# Patient Record
Sex: Male | Born: 1980 | Race: White | Hispanic: No | Marital: Married
Health system: Southern US, Community
[De-identification: ages and names within clinical notes are randomized; demographics above are authoritative.]

---

## 2018-12-09 DIAGNOSIS — R197 Diarrhea, unspecified: Secondary | ICD-10-CM | POA: Diagnosis not present

## 2018-12-09 DIAGNOSIS — R52 Pain, unspecified: Secondary | ICD-10-CM | POA: Diagnosis not present

## 2019-07-04 DIAGNOSIS — Z23 Encounter for immunization: Secondary | ICD-10-CM | POA: Diagnosis not present

## 2019-07-04 DIAGNOSIS — Z0000 Encounter for general adult medical examination without abnormal findings: Secondary | ICD-10-CM | POA: Diagnosis not present

## 2020-03-05 DIAGNOSIS — H029 Unspecified disorder of eyelid: Secondary | ICD-10-CM | POA: Diagnosis not present

## 2020-03-05 DIAGNOSIS — R030 Elevated blood-pressure reading, without diagnosis of hypertension: Secondary | ICD-10-CM | POA: Diagnosis not present

## 2020-03-14 DIAGNOSIS — H0011 Chalazion right upper eyelid: Secondary | ICD-10-CM | POA: Diagnosis not present

## 2020-04-18 DIAGNOSIS — F341 Dysthymic disorder: Secondary | ICD-10-CM | POA: Diagnosis not present

## 2020-05-16 DIAGNOSIS — F341 Dysthymic disorder: Secondary | ICD-10-CM | POA: Diagnosis not present

## 2020-06-20 DIAGNOSIS — F341 Dysthymic disorder: Secondary | ICD-10-CM | POA: Diagnosis not present

## 2020-07-10 DIAGNOSIS — Z0000 Encounter for general adult medical examination without abnormal findings: Secondary | ICD-10-CM | POA: Diagnosis not present

## 2020-07-10 DIAGNOSIS — E669 Obesity, unspecified: Secondary | ICD-10-CM | POA: Diagnosis not present

## 2020-07-10 DIAGNOSIS — Z713 Dietary counseling and surveillance: Secondary | ICD-10-CM | POA: Diagnosis not present

## 2020-07-10 DIAGNOSIS — Z13220 Encounter for screening for lipoid disorders: Secondary | ICD-10-CM | POA: Diagnosis not present

## 2020-08-05 ENCOUNTER — Emergency Department (HOSPITAL_BASED_OUTPATIENT_CLINIC_OR_DEPARTMENT_OTHER)
Admission: EM | Admit: 2020-08-05 | Discharge: 2020-08-05 | Disposition: A | Discharge status: Home / Self Care | Payer: BC Managed Care – PPO | Attending: Emergency Medicine | Admitting: Emergency Medicine

## 2020-08-05 ENCOUNTER — Encounter (HOSPITAL_BASED_OUTPATIENT_CLINIC_OR_DEPARTMENT_OTHER): Payer: Self-pay | Admitting: Emergency Medicine

## 2020-08-05 ENCOUNTER — Other Ambulatory Visit: Payer: Self-pay

## 2020-08-05 ENCOUNTER — Emergency Department (HOSPITAL_BASED_OUTPATIENT_CLINIC_OR_DEPARTMENT_OTHER): Payer: BC Managed Care – PPO

## 2020-08-05 ENCOUNTER — Emergency Department (HOSPITAL_COMMUNITY): Payer: BC Managed Care – PPO

## 2020-08-05 DIAGNOSIS — R1011 Right upper quadrant pain: Secondary | ICD-10-CM | POA: Diagnosis not present

## 2020-08-05 DIAGNOSIS — R1010 Upper abdominal pain, unspecified: Secondary | ICD-10-CM

## 2020-08-05 DIAGNOSIS — K7689 Other specified diseases of liver: Secondary | ICD-10-CM | POA: Diagnosis not present

## 2020-08-05 DIAGNOSIS — R109 Unspecified abdominal pain: Secondary | ICD-10-CM | POA: Diagnosis not present

## 2020-08-05 DIAGNOSIS — K760 Fatty (change of) liver, not elsewhere classified: Secondary | ICD-10-CM | POA: Diagnosis not present

## 2020-08-05 DIAGNOSIS — K828 Other specified diseases of gallbladder: Secondary | ICD-10-CM | POA: Diagnosis not present

## 2020-08-05 DIAGNOSIS — R110 Nausea: Secondary | ICD-10-CM | POA: Insufficient documentation

## 2020-08-05 DIAGNOSIS — K8020 Calculus of gallbladder without cholecystitis without obstruction: Secondary | ICD-10-CM | POA: Diagnosis not present

## 2020-08-05 LAB — CBC WITH DIFFERENTIAL/PLATELET
Abs Immature Granulocytes: 0.02 10*3/uL (ref 0.00–0.07)
Basophils Absolute: 0.1 10*3/uL (ref 0.0–0.1)
Basophils Relative: 1 %
Eosinophils Absolute: 0.2 10*3/uL (ref 0.0–0.5)
Eosinophils Relative: 2 %
HCT: 44.6 % (ref 39.0–52.0)
Hemoglobin: 15 g/dL (ref 13.0–17.0)
Immature Granulocytes: 0 %
Lymphocytes Relative: 32 %
Lymphs Abs: 2.5 10*3/uL (ref 0.7–4.0)
MCH: 28.8 pg (ref 26.0–34.0)
MCHC: 33.6 g/dL (ref 30.0–36.0)
MCV: 85.8 fL (ref 80.0–100.0)
Monocytes Absolute: 0.7 10*3/uL (ref 0.1–1.0)
Monocytes Relative: 9 %
Neutro Abs: 4.3 10*3/uL (ref 1.7–7.7)
Neutrophils Relative %: 56 %
Platelets: 313 10*3/uL (ref 150–400)
RBC: 5.2 MIL/uL (ref 4.22–5.81)
RDW: 12.9 % (ref 11.5–15.5)
WBC: 7.9 10*3/uL (ref 4.0–10.5)
nRBC: 0 % (ref 0.0–0.2)

## 2020-08-05 LAB — COMPREHENSIVE METABOLIC PANEL
ALT: 89 U/L — ABNORMAL HIGH (ref 0–44)
AST: 35 U/L (ref 15–41)
Albumin: 4.4 g/dL (ref 3.5–5.0)
Alkaline Phosphatase: 50 U/L (ref 38–126)
Anion gap: 11 (ref 5–15)
BUN: 28 mg/dL — ABNORMAL HIGH (ref 6–20)
CO2: 23 mmol/L (ref 22–32)
Calcium: 9.4 mg/dL (ref 8.9–10.3)
Chloride: 105 mmol/L (ref 98–111)
Creatinine, Ser: 0.97 mg/dL (ref 0.61–1.24)
GFR, Estimated: >60 mL/min (ref >60)
Glucose, Bld: 111 mg/dL — ABNORMAL HIGH (ref 70–99)
Potassium: 4 mmol/L (ref 3.5–5.1)
Sodium: 139 mmol/L (ref 135–145)
Total Bilirubin: 0.5 mg/dL (ref 0.3–1.2)
Total Protein: 7.3 g/dL (ref 6.5–8.1)

## 2020-08-05 LAB — LIPASE, BLOOD: Lipase: 26 U/L (ref 11–51)

## 2020-08-05 MED ORDER — ONDANSETRON HCL 4 MG/2ML IJ SOLN
4.0000 mg | Freq: Once | INTRAMUSCULAR | Status: AC
  Administered 2020-08-05: 4 mg via INTRAVENOUS
  Filled 2020-08-05: qty 2

## 2020-08-05 MED ORDER — DICYCLOMINE HCL 10 MG/ML IM SOLN
20.0000 mg | Freq: Once | INTRAMUSCULAR | Status: AC
  Administered 2020-08-05: 20 mg via INTRAMUSCULAR
  Filled 2020-08-05: qty 2

## 2020-08-05 MED ORDER — ALUM & MAG HYDROXIDE-SIMETH 200-200-20 MG/5ML PO SUSP
30.0000 mL | Freq: Once | ORAL | Status: AC
  Administered 2020-08-05: 30 mL via ORAL
  Filled 2020-08-05: qty 30

## 2020-08-05 MED ORDER — LACTATED RINGERS IV BOLUS
1000.0000 mL | Freq: Once | INTRAVENOUS | Status: AC
  Administered 2020-08-05: 1000 mL via INTRAVENOUS

## 2020-08-05 MED ORDER — FENTANYL CITRATE (PF) 100 MCG/2ML IJ SOLN
50.0000 ug | Freq: Once | INTRAMUSCULAR | Status: AC
  Administered 2020-08-05: 50 ug via INTRAVENOUS
  Filled 2020-08-05: qty 2

## 2020-08-05 MED ORDER — DICYCLOMINE HCL 20 MG PO TABS: 20.0000 mg | ORAL_TABLET | Freq: Two times a day (BID) | ORAL | 0 refills | Status: AC

## 2020-08-05 MED ORDER — MORPHINE SULFATE (PF) 4 MG/ML IV SOLN
4.0000 mg | Freq: Once | INTRAVENOUS | Status: AC
  Administered 2020-08-05: 4 mg via INTRAVENOUS
  Filled 2020-08-05: qty 1

## 2020-08-05 NOTE — ED Provider Notes (Signed)
[facility] DEPT Provider Note   CSN: [id number] Arrival date & time: 08/05/20  0446     History Chief Complaint  Patient presents with  . Abdominal Pain    Eduardo Garcia is a 40 y.o. male.  HPI      Eduardo Garcia is a 40 y.o. male, patient with no diagnosed past medical history, presenting to the ED with upper abdominal pain beginning around 3 AM this morning. Pain woke the patient from sleep.  Last food was around 6 PM yesterday evening.  He describes it as a soreness and aching in the epigastric region, radiating toward the right upper quadrant, initially 9/10, constant. Denies routine alcohol use, illicit drug use, routine NSAID use. Patient was transferred from [facility] for ultrasound to evaluate gallbladder.  Denies fever/chills, N/V/D, chest pain, shortness of breath, urinary symptoms, hematochezia/melena, or any other complaints.   History reviewed. No pertinent past medical history.  There are no problems to display for this patient.   History reviewed. No pertinent surgical history.     History reviewed. No pertinent family history.     Home Medications Prior to Admission medications   Medication Sig Start Date End Date Taking? Authorizing Provider  dicyclomine (BENTYL) 20 MG tablet Take 1 tablet (20 mg total) by mouth 2 (two) times daily. 08/05/20  Yes Naaman Curro C, PA-C    Allergies    Patient has no known allergies.  Review of Systems   Review of Systems  Constitutional: Negative for chills, diaphoresis and fever.  Respiratory: Negative for shortness of breath.   Cardiovascular: Negative for chest pain.  Gastrointestinal: Positive for abdominal pain. Negative for blood in stool, diarrhea, nausea and vomiting.  Genitourinary: Negative for difficulty urinating, dysuria, flank pain and hematuria.  Musculoskeletal: Negative for back pain.  Neurological: Negative for syncope and weakness.  All other  systems reviewed and are negative.   Physical Exam Updated Vital Signs BP (!) 155/90 (BP Location: Right Arm)   Pulse 80   Temp 98 F (36.7 C)   Resp 20   Ht '6\' 1"'  (1.854 m)   Wt 113.4 kg   SpO2 100%   BMI 32.98 kg/m   Physical Exam Vitals and nursing note reviewed.  Constitutional:      General: He is not in acute distress.    Appearance: He is well-developed. He is not diaphoretic.  HENT:     Head: Normocephalic and atraumatic.     Mouth/Throat:     Mouth: Mucous membranes are moist.     Pharynx: Oropharynx is clear.  Eyes:     Conjunctiva/sclera: Conjunctivae normal.  Cardiovascular:     Rate and Rhythm: Normal rate and regular rhythm.     Pulses: Normal pulses.     Heart sounds: Normal heart sounds.  Pulmonary:     Effort: Pulmonary effort is normal. No respiratory distress.     Breath sounds: Normal breath sounds.  Abdominal:     General: Bowel sounds are normal.     Palpations: Abdomen is soft.     Tenderness: There is abdominal tenderness. There is no guarding.    Musculoskeletal:     Cervical back: Neck supple.  Skin:    General: Skin is warm and dry.  Neurological:     Mental Status: He is alert.  Psychiatric:        Mood and Affect: Mood and affect normal.        Speech: Speech normal.  Behavior: Behavior normal.     ED Results / Procedures / Treatments   Labs (all labs ordered are listed, but only abnormal results are displayed) Labs Reviewed  COMPREHENSIVE METABOLIC PANEL - Abnormal; Notable for the following components:      Result Value   Glucose, Bld 111 (*)    BUN 28 (*)    ALT 89 (*)    All other components within normal limits  CBC WITH DIFFERENTIAL/PLATELET  LIPASE, BLOOD    EKG None  Radiology US Abdomen Limited  Result Date: 08/05/2020 CLINICAL DATA:  40 year old male with right upper quadrant abdominal pain. Cholelithiasis on noncontrast CT Abdomen and Pelvis earlier today. EXAM: ULTRASOUND ABDOMEN LIMITED RIGHT  UPPER QUADRANT COMPARISON:  CT Abdomen and Pelvis 08/05/2020. FINDINGS: Gallbladder: Shadowing echogenic gallstones, individually estimated up to 7 mm (images 2, 22). Gallbladder wall thickness remains normal. No pericholecystic fluid. No sonographic Murphy sign elicited. Common bile duct: Diameter: 4 mm, normal. Liver: Echogenic background liver parenchyma (image 30) with around 2.7 cm hypoechoic area in the right hepatic lobe with some increased through transmission. No hypervascularity (image 21). Second similar small hypoechoic lesion in the right lobe measuring 15 mm on image 39. portal vein is patent on color Doppler imaging with normal direction of blood flow towards the liver. Other: Negative visible right kidney. IMPRESSION: 1. Cholelithiasis without evidence of acute cholecystitis or bile duct obstruction. 2. Fatty liver disease with two nonspecific hypoechoic liver lesions in the right lobe, the larger is 2.7 cm. Recommend routine outpatient follow-up abdomen MRI (liver protocol without and with contrast) to further characterize. This recommendation adapted from ACR consensus guidelines: Management of Incidental Liver Lesions on CT: A White Paper of the ACR Incidental Findings Committee. J Am Coll Radiol 2017; 95:0932-6712. Electronically Signed   By: Genevie Ann M.D.   On: 08/05/2020 08:11   CT Renal Stone Study  Result Date: 08/05/2020 CLINICAL DATA:  Upper abdominal pain which began at 0330 hours EXAM: CT ABDOMEN AND PELVIS WITHOUT CONTRAST TECHNIQUE: Multidetector CT imaging of the abdomen and pelvis was performed following the standard protocol without IV contrast. COMPARISON:  None. FINDINGS: Lower chest: Lung bases are clear. Normal heart size. No pericardial effusion. Hepatobiliary: Diffuse hepatic hypoattenuation compatible with hepatic steatosis. Regions of geographic focal fatty sparing are seen along the gallbladder fossa. An additional rounded subcapsular focus of intermediate attenuation  measuring up to 2.1 cm in size (2/30) in the periphery of the right lobe liver, could reflect additional fatty sparing or small lesion incompletely characterized on this exam. Smooth liver surface contour. Physiologic gallbladder distention. Few scattered calcified and partially pneumatized gallstones are seen within the gallbladder lumen towards the neck. No pericholecystic fluid or inflammation. No significant biliary ductal dilatation or intraductal gallstones are evident. Pancreas: No pancreatic ductal dilatation or surrounding inflammatory changes. Spleen: Normal in size. No concerning splenic lesions. Adrenals/Urinary Tract: Normal adrenals. Kidneys are symmetric in size and normally located. Some cortical lobulations may reflect regions of scarring or persistent fetal lobulation. No visible or contour deforming renal lesion. No urolithiasis or hydronephrosis. Urinary bladder is unremarkable. Stomach/Bowel: Distal esophagus, stomach and duodenum are unremarkable and free of acute abnormality. No small bowel thickening or dilatation. Normal air-filled appendix in the right lower quadrant without periappendiceal inflammation. No colonic dilatation or wall thickening. No evidence of bowel obstruction. Vascular/Lymphatic: No significant vascular findings are present. No enlarged abdominal or pelvic lymph nodes. Reproductive: The prostate and seminal vesicles are unremarkable. Other: No abdominopelvic free fluid or  free gas. No bowel containing hernias. Musculoskeletal: No acute osseous abnormality or suspicious osseous lesion. IMPRESSION: No evidence of acute urinary tract abnormality, specifically no hydronephrosis or obstructive urolithiasis. Cholelithiasis without evidence of acute cholecystitis or biliary ductal dilatation. Diffuse hepatic steatosis with focal fatty sparing along the gallbladder fossa. More rounded ill-defined subcapsular focus of intermediate attenuation in the right lobe liver, could reflect  additional fatty sparing or a small indeterminate liver lesion. Consider further evaluation with outpatient ultrasound or MR. Electronically Signed   By: Lovena Le M.D.   On: 08/05/2020 06:03    Procedures Procedures   Medications Ordered in ED Medications  ondansetron (ZOFRAN) injection 4 mg (4 mg Intravenous Given 08/05/20 0509)  fentaNYL (SUBLIMAZE) injection 50 mcg (50 mcg Intravenous Given 08/05/20 0509)  dicyclomine (BENTYL) injection 20 mg (20 mg Intramuscular Given 08/05/20 0533)  morphine 4 MG/ML injection 4 mg (4 mg Intravenous Given 08/05/20 0607)  alum & mag hydroxide-simeth (MAALOX/MYLANTA) 200-200-20 MG/5ML suspension 30 mL (30 mLs Oral Given 08/05/20 0615)  lactated ringers bolus 1,000 mL (0 mLs Intravenous Stopped 08/05/20 0946)    ED Course  I have reviewed the triage vital signs and the nursing notes.  Pertinent labs & imaging results that were available during my care of the patient were reviewed by me and considered in my medical decision making (see chart for details).  Clinical Course as of 08/05/20 1013  Sun Aug 05, 2020  0734 Pain currently rated 5-6/10.  Declines additional analgesic. [SJ]    Clinical Course User Index [SJ] Laketra Bowdish C, PA-C   MDM Rules/Calculators/A&P                          Patient presents with upper abdominal pain beginning early this morning. Patient is nontoxic appearing, afebrile, not tachycardic, not tachypneic, not hypotensive, maintains excellent SPO2 on room air, and is in no apparent distress.   I have reviewed the patient's chart to obtain more information.   I reviewed and interpreted the patient's labs and radiological studies. Lab results reassuring.  No leukocytosis.  Mild ALT elevation, however, AST, alk phos, bilirubin, lipase are all within normal limits.  Cholelithiasis without evidence of cholecystitis noted on CT and again noted on ultrasound.  Incidental finding of hepatic steatosis as well as 2 liver lesions were  also noted.  These were discussed with the patient as well as the recommendation for follow-up imaging.  The patient was given instructions for home care as well as return precautions. Patient voices understanding of these instructions, accepts the plan, and is comfortable with discharge.   Vitals:   08/05/20 0702 08/05/20 0800 08/05/20 0900 08/05/20 0935  BP: (!) 155/90 (!) 143/93 (!) 141/89 135/80  Pulse: 80 83 79 97  Resp: '20 18 15 16  ' Temp: 98 F (36.7 C)   97.7 F (36.5 C)  TempSrc:    Oral  SpO2: 100% 99% 97% 100%  Weight:      Height:         Final Clinical Impression(s) / ED Diagnoses Final diagnoses:  Pain of upper abdomen    Rx / DC Orders ED Discharge Orders         Ordered    dicyclomine (BENTYL) 20 MG tablet  2 times daily        08/05/20 0914           Lorayne Bender, PA-C 08/05/20 1018    Lacretia Leigh, MD 08/07/20 1014

## 2020-08-05 NOTE — ED Provider Notes (Signed)
[facility] EMERGENCY DEPARTMENT Provider Note   CSN: [id number] Arrival date & time: 08/05/20  0446     History Chief Complaint  Patient presents with  . Abdominal Pain    Eduardo Garcia is a 40 y.o. male.  The history is provided by the patient.  Abdominal Pain Pain location:  Epigastric and RUQ Pain radiates to:  Does not radiate Pain severity:  Severe Onset quality:  Sudden Duration:  2 hours Timing:  Constant Progression:  Unchanged Chronicity:  New Context comment:  Ate [place] food this evening Relieved by:  Nothing Worsened by:  Nothing Ineffective treatments:  None tried Associated symptoms: nausea   Associated symptoms: no constipation, no cough, no diarrhea, no dysuria, no fever and no vomiting   Risk factors: no alcohol abuse and has not had multiple surgeries        History reviewed. No pertinent past medical history.  There are no problems to display for this patient.   History reviewed. No pertinent surgical history.     History reviewed. No pertinent family history.     Home Medications Prior to Admission medications   Not on File    Allergies    Patient has no allergy information on record.  Review of Systems   Review of Systems  Constitutional: Negative for fever.  HENT: Negative for drooling.   Eyes: Negative for visual disturbance.  Respiratory: Negative for cough.   Cardiovascular: Negative for leg swelling.  Gastrointestinal: Positive for abdominal pain and nausea. Negative for constipation, diarrhea and vomiting.  Genitourinary: Negative for dysuria.  Musculoskeletal: Negative for neck stiffness.  Skin: Negative for rash.  Neurological: Negative for facial asymmetry.  Psychiatric/Behavioral: Negative for agitation.  All other systems reviewed and are negative.   Physical Exam Updated Vital Signs BP (!) 163/104   Pulse 79   Temp 97.8 F (36.6 C)   Resp 16   Ht 6\' 1"  (1.854 m)   Wt 113.4 kg   SpO2 100%    BMI 32.98 kg/m   Physical Exam Vitals and nursing note reviewed.  Constitutional:      General: He is not in acute distress.    Appearance: Normal appearance.  HENT:     Head: Normocephalic and atraumatic.     Nose: Nose normal.  Eyes:     Conjunctiva/sclera: Conjunctivae normal.     Pupils: Pupils are equal, round, and reactive to light.  Cardiovascular:     Rate and Rhythm: Normal rate and regular rhythm.     Pulses: Normal pulses.     Heart sounds: Normal heart sounds.  Pulmonary:     Effort: Pulmonary effort is normal.     Breath sounds: Normal breath sounds.  Abdominal:     Palpations: Abdomen is soft.     Tenderness: There is abdominal tenderness. Positive signs include Murphy's sign.     Comments: Hyperactive BS  Musculoskeletal:        General: Normal range of motion.     Cervical back: Normal range of motion and neck supple.  Skin:    General: Skin is warm and dry.     Capillary Refill: Capillary refill takes less than 2 seconds.  Neurological:     General: No focal deficit present.     Mental Status: He is alert and oriented to person, place, and time.     Deep Tendon Reflexes: Reflexes normal.  Psychiatric:        Mood and Affect: Mood normal.  Behavior: Behavior normal.     ED Results / Procedures / Treatments   Labs (all labs ordered are listed, but only abnormal results are displayed) Results for orders placed or performed during the hospital encounter of 08/05/20  CBC with Differential/Platelet  Result Value Ref Range   WBC 7.9 4.0 - 10.5 K/uL   RBC 5.20 4.22 - 5.81 MIL/uL   Hemoglobin 15.0 13.0 - 17.0 g/dL   HCT 40.3 47.4 - 25.9 %   MCV 85.8 80.0 - 100.0 fL   MCH 28.8 26.0 - 34.0 pg   MCHC 33.6 30.0 - 36.0 g/dL   RDW 56.3 87.5 - 64.3 %   Platelets 313 150 - 400 K/uL   nRBC 0.0 0.0 - 0.2 %   Neutrophils Relative % 56 %   Neutro Abs 4.3 1.7 - 7.7 K/uL   Lymphocytes Relative 32 %   Lymphs Abs 2.5 0.7 - 4.0 K/uL   Monocytes Relative 9 %    Monocytes Absolute 0.7 0.1 - 1.0 K/uL   Eosinophils Relative 2 %   Eosinophils Absolute 0.2 0.0 - 0.5 K/uL   Basophils Relative 1 %   Basophils Absolute 0.1 0.0 - 0.1 K/uL   Immature Granulocytes 0 %   Abs Immature Granulocytes 0.02 0.00 - 0.07 K/uL  Comprehensive metabolic panel  Result Value Ref Range   Sodium 139 135 - 145 mmol/L   Potassium 4.0 3.5 - 5.1 mmol/L   Chloride 105 98 - 111 mmol/L   CO2 23 22 - 32 mmol/L   Glucose, Bld 111 (H) 70 - 99 mg/dL   BUN 28 (H) 6 - 20 mg/dL   Creatinine, Ser 3.29 0.61 - 1.24 mg/dL   Calcium 9.4 8.9 - 51.8 mg/dL   Total Protein 7.3 6.5 - 8.1 g/dL   Albumin 4.4 3.5 - 5.0 g/dL   AST 35 15 - 41 U/L   ALT 89 (H) 0 - 44 U/L   Alkaline Phosphatase 50 38 - 126 U/L   Total Bilirubin 0.5 0.3 - 1.2 mg/dL   GFR, Estimated >84 >16 mL/min   Anion gap 11 5 - 15  Lipase, blood  Result Value Ref Range   Lipase 26 11 - 51 U/L   No results found.  Radiology No results found.  Procedures Procedures   Medications Ordered in ED Medications  morphine 4 MG/ML injection 4 mg (has no administration in time range)  ondansetron (ZOFRAN) injection 4 mg (4 mg Intravenous Given 08/05/20 0509)  fentaNYL (SUBLIMAZE) injection 50 mcg (50 mcg Intravenous Given 08/05/20 0509)  dicyclomine (BENTYL) injection 20 mg (20 mg Intramuscular Given 08/05/20 0533)    ED Course  I have reviewed the triage vital signs and the nursing notes.  Pertinent labs & imaging results that were available during my care of the patient were reviewed by me and considered in my medical decision making (see chart for details).   Patient  Final Clinical Impression(s) / ED Diagnoses Final diagnoses:  Pain of upper abdomen    Transfer to [facility] ED POV, accepted by Dr. Ronald Lobo, Dawsyn Ramsaran, MD 08/05/20 6063

## 2020-08-05 NOTE — Discharge Instructions (Addendum)
  Diet: Start with a clear liquid diet, progressed to a full liquid diet, and then bland solids as you are able. Please adhere to the enclosed dietary suggestions.  In general, avoid NSAIDs (i.e. ibuprofen, naproxen, etc.), caffeine, alcohol, spicy foods, fatty foods, or any other foods that seem to cause your symptoms to arise.  Dicyclomine: Dicyclomine (generic for Bentyl) is what is known as an antispasmodic and is intended to help reduce abdominal discomfort.  Follow-up: Please follow-up with gastroenterology or your primary care provider on this matter on this matter.  Return: Return to the ED for significantly worsening symptoms, persistent vomiting, persistent fever, vomiting blood, blood in the stools, dark stools, or any other major concerns.  For prescription assistance, may try using prescription discount sites or apps, such as goodrx.com  There were some abnormalities noted to the liver on imaging studies today.  Please refer to the studies for further details.  Recommend setting up MRI of the abdomen with liver protocol with and without contrast.  Coordinate through your primary care provider for this study.

## 2020-08-05 NOTE — ED Notes (Signed)
Primary RN instructed pt to head towards [facility] ER for ultrasound. Pt & wife informed that if pt decides not to go to ER that pt needs to have IV removed by medical personnel. Pt understood and ambulated out of ED w/o assistance.

## 2020-08-05 NOTE — ED Triage Notes (Signed)
C/o upper abd pain starting around 0330. States nauseated but denies vomiting/diarrhea.

## 2020-08-05 NOTE — ED Notes (Signed)
Patient transported to CT 

## 2020-08-10 DIAGNOSIS — R945 Abnormal results of liver function studies: Secondary | ICD-10-CM | POA: Diagnosis not present

## 2020-08-10 DIAGNOSIS — K8020 Calculus of gallbladder without cholecystitis without obstruction: Secondary | ICD-10-CM | POA: Diagnosis not present

## 2020-08-10 DIAGNOSIS — R109 Unspecified abdominal pain: Secondary | ICD-10-CM | POA: Diagnosis not present

## 2020-08-20 ENCOUNTER — Other Ambulatory Visit: Payer: Self-pay | Admitting: Physician Assistant

## 2020-08-20 DIAGNOSIS — K8020 Calculus of gallbladder without cholecystitis without obstruction: Secondary | ICD-10-CM | POA: Diagnosis not present

## 2020-08-20 DIAGNOSIS — J918 Pleural effusion in other conditions classified elsewhere: Secondary | ICD-10-CM

## 2020-08-20 DIAGNOSIS — R072 Precordial pain: Secondary | ICD-10-CM | POA: Diagnosis not present

## 2020-08-20 DIAGNOSIS — K769 Liver disease, unspecified: Secondary | ICD-10-CM | POA: Diagnosis not present

## 2020-08-20 DIAGNOSIS — K760 Fatty (change of) liver, not elsewhere classified: Secondary | ICD-10-CM | POA: Diagnosis not present

## 2020-08-31 ENCOUNTER — Other Ambulatory Visit: Payer: Self-pay

## 2020-08-31 ENCOUNTER — Ambulatory Visit
Admission: RE | Admit: 2020-08-31 | Discharge: 2020-08-31 | Disposition: A | Discharge status: Home / Self Care | Payer: BC Managed Care – PPO | Source: Ambulatory Visit | Attending: Physician Assistant | Admitting: Physician Assistant

## 2020-08-31 DIAGNOSIS — D1803 Hemangioma of intra-abdominal structures: Secondary | ICD-10-CM | POA: Diagnosis not present

## 2020-08-31 DIAGNOSIS — K760 Fatty (change of) liver, not elsewhere classified: Secondary | ICD-10-CM | POA: Diagnosis not present

## 2020-08-31 DIAGNOSIS — K8020 Calculus of gallbladder without cholecystitis without obstruction: Secondary | ICD-10-CM | POA: Diagnosis not present

## 2020-08-31 DIAGNOSIS — K769 Liver disease, unspecified: Secondary | ICD-10-CM

## 2020-08-31 DIAGNOSIS — J918 Pleural effusion in other conditions classified elsewhere: Secondary | ICD-10-CM

## 2020-08-31 MED ORDER — GADOBENATE DIMEGLUMINE 529 MG/ML IV SOLN
20.0000 mL | Freq: Once | INTRAVENOUS | Status: AC | PRN
  Administered 2020-08-31: 20 mL via INTRAVENOUS

## 2020-09-17 DIAGNOSIS — R1013 Epigastric pain: Secondary | ICD-10-CM | POA: Diagnosis not present

## 2020-09-17 DIAGNOSIS — R1011 Right upper quadrant pain: Secondary | ICD-10-CM | POA: Diagnosis not present

## 2020-10-10 DIAGNOSIS — K760 Fatty (change of) liver, not elsewhere classified: Secondary | ICD-10-CM | POA: Diagnosis not present

## 2020-10-10 DIAGNOSIS — K8020 Calculus of gallbladder without cholecystitis without obstruction: Secondary | ICD-10-CM | POA: Diagnosis not present

## 2020-10-10 DIAGNOSIS — R1013 Epigastric pain: Secondary | ICD-10-CM | POA: Diagnosis not present

## 2020-11-21 DIAGNOSIS — K219 Gastro-esophageal reflux disease without esophagitis: Secondary | ICD-10-CM | POA: Diagnosis not present

## 2020-11-21 DIAGNOSIS — R1013 Epigastric pain: Secondary | ICD-10-CM | POA: Diagnosis not present

## 2020-11-21 DIAGNOSIS — K2970 Gastritis, unspecified, without bleeding: Secondary | ICD-10-CM | POA: Diagnosis not present

## 2020-11-21 DIAGNOSIS — K317 Polyp of stomach and duodenum: Secondary | ICD-10-CM | POA: Diagnosis not present

## 2020-12-26 DIAGNOSIS — F341 Dysthymic disorder: Secondary | ICD-10-CM | POA: Diagnosis not present

## 2021-01-23 DIAGNOSIS — F341 Dysthymic disorder: Secondary | ICD-10-CM | POA: Diagnosis not present

## 2021-02-20 DIAGNOSIS — F341 Dysthymic disorder: Secondary | ICD-10-CM | POA: Diagnosis not present

## 2021-02-21 DIAGNOSIS — Z23 Encounter for immunization: Secondary | ICD-10-CM | POA: Diagnosis not present

## 2021-03-12 DIAGNOSIS — E782 Mixed hyperlipidemia: Secondary | ICD-10-CM | POA: Diagnosis not present

## 2021-03-12 DIAGNOSIS — K760 Fatty (change of) liver, not elsewhere classified: Secondary | ICD-10-CM | POA: Diagnosis not present

## 2021-03-12 DIAGNOSIS — R945 Abnormal results of liver function studies: Secondary | ICD-10-CM | POA: Diagnosis not present

## 2021-04-29 DIAGNOSIS — R748 Abnormal levels of other serum enzymes: Secondary | ICD-10-CM | POA: Diagnosis not present

## 2021-04-29 DIAGNOSIS — R945 Abnormal results of liver function studies: Secondary | ICD-10-CM | POA: Diagnosis not present

## 2021-05-01 DIAGNOSIS — F341 Dysthymic disorder: Secondary | ICD-10-CM | POA: Diagnosis not present

## 2021-06-05 DIAGNOSIS — F341 Dysthymic disorder: Secondary | ICD-10-CM | POA: Diagnosis not present

## 2021-07-16 DIAGNOSIS — Z0000 Encounter for general adult medical examination without abnormal findings: Secondary | ICD-10-CM | POA: Diagnosis not present

## 2021-07-16 DIAGNOSIS — F341 Dysthymic disorder: Secondary | ICD-10-CM | POA: Diagnosis not present

## 2021-08-28 DIAGNOSIS — F341 Dysthymic disorder: Secondary | ICD-10-CM | POA: Diagnosis not present

## 2021-09-25 DIAGNOSIS — F341 Dysthymic disorder: Secondary | ICD-10-CM | POA: Diagnosis not present

## 2021-11-20 DIAGNOSIS — F341 Dysthymic disorder: Secondary | ICD-10-CM | POA: Diagnosis not present

## 2022-01-08 DIAGNOSIS — F341 Dysthymic disorder: Secondary | ICD-10-CM | POA: Diagnosis not present

## 2022-02-03 ENCOUNTER — Emergency Department (HOSPITAL_COMMUNITY): Payer: BC Managed Care – PPO

## 2022-02-03 ENCOUNTER — Emergency Department (HOSPITAL_COMMUNITY)
Admission: EM | Admit: 2022-02-03 | Discharge: 2022-02-03 | Disposition: A | Discharge status: Home / Self Care | Payer: BC Managed Care – PPO | Attending: Emergency Medicine | Admitting: Emergency Medicine

## 2022-02-03 ENCOUNTER — Other Ambulatory Visit: Payer: Self-pay

## 2022-02-03 DIAGNOSIS — K8020 Calculus of gallbladder without cholecystitis without obstruction: Secondary | ICD-10-CM | POA: Insufficient documentation

## 2022-02-03 DIAGNOSIS — R1011 Right upper quadrant pain: Secondary | ICD-10-CM | POA: Diagnosis not present

## 2022-02-03 DIAGNOSIS — D1803 Hemangioma of intra-abdominal structures: Secondary | ICD-10-CM | POA: Diagnosis not present

## 2022-02-03 DIAGNOSIS — K760 Fatty (change of) liver, not elsewhere classified: Secondary | ICD-10-CM | POA: Diagnosis not present

## 2022-02-03 MED ORDER — OXYCODONE HCL 5 MG PO TABS: 5.0000 mg | ORAL_TABLET | Freq: Four times a day (QID) | ORAL | 0 refills | Status: AC | PRN

## 2022-02-03 MED ORDER — ONDANSETRON 4 MG PO TBDP: 4.0000 mg | ORAL_TABLET | Freq: Three times a day (TID) | ORAL | 0 refills | Status: AC | PRN

## 2022-02-03 MED ORDER — OXYCODONE-ACETAMINOPHEN 5-325 MG PO TABS
1.0000 | ORAL_TABLET | Freq: Once | ORAL | Status: AC
  Administered 2022-02-03: 1 via ORAL
  Filled 2022-02-03: qty 1

## 2022-02-03 NOTE — ED Triage Notes (Addendum)
Pt reports generalized abd pain x5 hours that woke him from sleep.  Hx of same pain a year ago.  Denies GI hx.

## 2022-02-03 NOTE — Discharge Instructions (Addendum)
You have gallstones.  Recommend focusing on a liquid diet here for the next 24 hours and then avoiding fatty foods or greasy foods or foods high in protein for the next few days.  Follow-up with general surgery.  If you develop worsening pain or fever please return for evaluation.  I have written you for narcotic pain medicine for breakthrough pain.  Otherwise I would use Tylenol and ibuprofen for your pain.  I have written you for nausea medicine as well.

## 2022-02-03 NOTE — ED Provider Triage Note (Signed)
Emergency Medicine Provider Triage Evaluation Note  Eduardo Garcia , a 41 y.o. male  was evaluated in triage.  Pt complains of epigastric to right upper quadrant pain that began 5am this morning, denies nausea, vomiting.  Denies history of intra-abdominal surgery, reports he does have a history of gallstones.  Patient reports pain not significantly changed with eating, does report some chills.  Review of Systems  Positive: Abdominal pain, chills Negative: Nausea, vomiting  Physical Exam  BP (!) 155/99 (BP Location: Left Arm)   Pulse 74   Temp 98.4 F (36.9 C) (Oral)   Resp 20   SpO2 100%  Gen:   Awake, no distress   Resp:  Normal effort  MSK:   Moves extremities without difficulty  Other:  TTP most focally in RUQ, extends into epigastric region  Medical Decision Making  Medically screening exam initiated at 11:04 AM.  Appropriate orders placed.  Eduardo Garcia was informed that the remainder of the evaluation will be completed by another provider, this initial triage assessment does not replace that evaluation, and the importance of remaining in the ED until their evaluation is complete.  Workup initiated   Anselmo Pickler, [place] 02/03/22 1107

## 2022-02-03 NOTE — ED Provider Notes (Signed)
[facility]-EMERGENCY DEPT Provider Note   CSN: [id number] Arrival date & time: 02/03/22  1019     History  Chief Complaint  Patient presents with   Abdominal Pain    Eduardo Garcia is a 41 y.o. male.  Patient here with right upper quadrant abdominal pain since this morning.  Started after eating food last night.  History of similar pain in the past but has not happened in several months.  No significant medical problems.  Pain is much improved.  He has been waiting to be seen for several hours.  Got some pain medicine in the waiting room.  He denies any nausea or vomiting.  Denies any black or bloody stools.  Denies any chest pain or shortness of breath or fever or chills.  The history is provided by the patient.       Home Medications Prior to Admission medications   Medication Sig Start Date End Date Taking? Authorizing Provider  ondansetron (ZOFRAN-ODT) 4 MG disintegrating tablet Take 1 tablet (4 mg total) by mouth every 8 (eight) hours as needed for nausea or vomiting. 02/03/22  Yes Janel Beane, DO  oxyCODONE (ROXICODONE) 5 MG immediate release tablet Take 1 tablet (5 mg total) by mouth every 6 (six) hours as needed for up to 20 doses for breakthrough pain. 02/03/22  Yes Linville Decarolis, DO  dicyclomine (BENTYL) 20 MG tablet Take 1 tablet (20 mg total) by mouth 2 (two) times daily. 08/05/20   Joy, Shawn C, PA-C      Allergies    Minocycline hcl    Review of Systems   Review of Systems  Physical Exam Updated Vital Signs BP (!) 170/106   Pulse (!) 103   Temp 97.9 F (36.6 C) (Oral)   Resp 20   SpO2 99%  Physical Exam Vitals and nursing note reviewed.  Constitutional:      General: He is not in acute distress.    Appearance: He is well-developed. He is not ill-appearing.  HENT:     Head: Normocephalic and atraumatic.  Eyes:     Extraocular Movements: Extraocular movements intact.     Conjunctiva/sclera: Conjunctivae normal.   Cardiovascular:     Rate and Rhythm: Normal rate and regular rhythm.     Heart sounds: Normal heart sounds. No murmur heard. Pulmonary:     Effort: Pulmonary effort is normal. No respiratory distress.     Breath sounds: Normal breath sounds.  Abdominal:     General: Bowel sounds are normal.     Palpations: Abdomen is soft.     Tenderness: There is no abdominal tenderness.  Musculoskeletal:        General: No swelling.     Cervical back: Neck supple.  Skin:    General: Skin is warm and dry.     Capillary Refill: Capillary refill takes less than 2 seconds.  Neurological:     Mental Status: He is alert.  Psychiatric:        Mood and Affect: Mood normal.     ED Results / Procedures / Treatments   Labs (all labs ordered are listed, but only abnormal results are displayed) Labs Reviewed - No data to display  EKG None  Radiology US Abdomen Limited RUQ (LIVER/GB)  Result Date: 02/03/2022 CLINICAL DATA:  Right upper quadrant abdominal pain since 5 a.m. EXAM: ULTRASOUND ABDOMEN LIMITED RIGHT UPPER QUADRANT COMPARISON:  08/05/2020 FINDINGS: Gallbladder: Multiple stones are noted measuring up to 8 mm. No gallbladder wall thickening  or pericholecystic fluid. No sludge. Common bile duct: Diameter: 4.7 mm.  No intrahepatic bile duct dilatation Liver: Increased parenchymal echogenicity suggesting hepatic steatosis. Two focal hypoechoic lesions are identified including: -Focal hypoechoic structure adjacent to the gallbladder measures 2.6 by 1.1 x 0.9 cm. Not seen on previous image. -Hypoechoic structure within left lobe of liver measures 3.1 x 2.2 x 3.3 cm. This corresponds to previous hemangioma seen on MRI from 08/31/2020. Portal vein is patent on color Doppler imaging with normal direction of blood flow towards the liver. Other: None. IMPRESSION: 1. Multiple gallstones. No gallbladder wall thickening or pericholecystic fluid. 2. Hepatic steatosis. 3. Indeterminate hypoechoic lesions within the  liver. Location and appearance suggest focal fatty sparing. Consider follow-up imaging with nonemergent liver MRI without and with contrast material 4. Hypoechoic structure within the left lobe of liver corresponds to previous hemangioma. Electronically Signed   By: Kerby Moors M.D.   On: 02/03/2022 11:52    Procedures Procedures    Medications Ordered in ED Medications  oxyCODONE-acetaminophen (PERCOCET/ROXICET) 5-325 MG per tablet 1 tablet (1 tablet Oral Given 02/03/22 1138)    ED Course/ Medical Decision Making/ A&P                           Medical Decision Making Risk Prescription drug management.   Eduardo Garcia is here with right upper quadrant abdominal pain.  Normal vitals.  No fever.  Already has had ultrasound prior to my evaluation.  Differential diagnosis includes cholecystitis versus may be less likely gastritis.  Clinically I do not think he has pancreatitis or any cardiac or pulmonary process.  He is not tender over his lower abdomen and no concern for appendicitis.  Pain is now improved.  He has been having this pain in the past especially after eating.  Ultrasound per radiology report shows gallstones but there is no gallbladder wall thickening or pericholecystic fluid.  There is hemangiomas which he has had MRI of in the past.  There also is some indeterminate hypoechoic lesions that likely are focal fat sparing.  He was made aware of this.  Will need another MRI to further evaluate this.  He will follow-up with his primary care doctor/GI doctor about this.  He has follow-up with GI tomorrow.  Pain is much improved.  He is able to tolerate p.o.  We will have him follow-up with general surgery.  Will prescribe Zofran and Roxicodone.  EKG was done in triage and per my review shows sinus rhythm.  No ischemic changes.  Patient discharged in good condition.  Understands return precautions.  This chart was dictated using voice recognition software.  Despite best efforts to  proofread,  errors can occur which can change the documentation meaning.         Final Clinical Impression(s) / ED Diagnoses Final diagnoses:  Gallstones    Rx / DC Orders ED Discharge Orders          Ordered    oxyCODONE (ROXICODONE) 5 MG immediate release tablet  Every 6 hours PRN        02/03/22 1901    ondansetron (ZOFRAN-ODT) 4 MG disintegrating tablet  Every 8 hours PRN        02/03/22 1901              Lennice Sites, DO 02/03/22 1905

## 2022-02-04 ENCOUNTER — Other Ambulatory Visit (HOSPITAL_COMMUNITY): Payer: Self-pay | Admitting: Gastroenterology

## 2022-02-04 ENCOUNTER — Other Ambulatory Visit: Payer: Self-pay | Admitting: Gastroenterology

## 2022-02-04 DIAGNOSIS — K760 Fatty (change of) liver, not elsewhere classified: Secondary | ICD-10-CM | POA: Diagnosis not present

## 2022-02-04 DIAGNOSIS — R1011 Right upper quadrant pain: Secondary | ICD-10-CM | POA: Diagnosis not present

## 2022-02-04 DIAGNOSIS — R932 Abnormal findings on diagnostic imaging of liver and biliary tract: Secondary | ICD-10-CM

## 2022-02-05 DIAGNOSIS — F341 Dysthymic disorder: Secondary | ICD-10-CM | POA: Diagnosis not present

## 2022-02-10 ENCOUNTER — Ambulatory Visit (HOSPITAL_COMMUNITY)
Admission: RE | Admit: 2022-02-10 | Discharge: 2022-02-10 | Disposition: A | Discharge status: Home / Self Care | Payer: BC Managed Care – PPO | Source: Ambulatory Visit | Attending: Gastroenterology | Admitting: Gastroenterology

## 2022-02-10 ENCOUNTER — Other Ambulatory Visit (HOSPITAL_COMMUNITY): Payer: Self-pay | Admitting: Gastroenterology

## 2022-02-10 DIAGNOSIS — R935 Abnormal findings on diagnostic imaging of other abdominal regions, including retroperitoneum: Secondary | ICD-10-CM | POA: Diagnosis not present

## 2022-02-10 DIAGNOSIS — D1803 Hemangioma of intra-abdominal structures: Secondary | ICD-10-CM | POA: Diagnosis not present

## 2022-02-10 DIAGNOSIS — R932 Abnormal findings on diagnostic imaging of liver and biliary tract: Secondary | ICD-10-CM | POA: Diagnosis not present

## 2022-02-10 DIAGNOSIS — K8020 Calculus of gallbladder without cholecystitis without obstruction: Secondary | ICD-10-CM | POA: Diagnosis not present

## 2022-02-10 DIAGNOSIS — K760 Fatty (change of) liver, not elsewhere classified: Secondary | ICD-10-CM | POA: Diagnosis not present

## 2022-02-10 MED ORDER — GADOBUTROL 1 MMOL/ML IV SOLN
7.5000 mL | Freq: Once | INTRAVENOUS | Status: AC | PRN
  Administered 2022-02-10: 7.5 mL via INTRAVENOUS

## 2022-02-19 ENCOUNTER — Ambulatory Visit (HOSPITAL_COMMUNITY)
Admission: RE | Admit: 2022-02-19 | Discharge: 2022-02-19 | Disposition: A | Discharge status: Home / Self Care | Payer: BC Managed Care – PPO | Source: Ambulatory Visit | Attending: Gastroenterology | Admitting: Gastroenterology

## 2022-02-19 DIAGNOSIS — R1011 Right upper quadrant pain: Secondary | ICD-10-CM | POA: Insufficient documentation

## 2022-02-19 DIAGNOSIS — R899 Unspecified abnormal finding in specimens from other organs, systems and tissues: Secondary | ICD-10-CM | POA: Diagnosis not present

## 2022-02-19 MED ORDER — TECHNETIUM TC 99M MEBROFENIN IV KIT
5.4000 | PACK | Freq: Once | INTRAVENOUS | Status: AC | PRN
  Administered 2022-02-19: 5.4 via INTRAVENOUS

## 2022-03-06 DIAGNOSIS — K8010 Calculus of gallbladder with chronic cholecystitis without obstruction: Secondary | ICD-10-CM | POA: Diagnosis not present

## 2022-03-06 DIAGNOSIS — K828 Other specified diseases of gallbladder: Secondary | ICD-10-CM | POA: Diagnosis not present

## 2022-03-19 DIAGNOSIS — F341 Dysthymic disorder: Secondary | ICD-10-CM | POA: Diagnosis not present

## 2022-04-22 DIAGNOSIS — R945 Abnormal results of liver function studies: Secondary | ICD-10-CM | POA: Diagnosis not present

## 2022-04-30 DIAGNOSIS — F341 Dysthymic disorder: Secondary | ICD-10-CM | POA: Diagnosis not present

## 2022-05-28 DIAGNOSIS — F341 Dysthymic disorder: Secondary | ICD-10-CM | POA: Diagnosis not present

## 2022-07-22 DIAGNOSIS — Z0000 Encounter for general adult medical examination without abnormal findings: Secondary | ICD-10-CM | POA: Diagnosis not present

## 2022-07-22 DIAGNOSIS — E782 Mixed hyperlipidemia: Secondary | ICD-10-CM | POA: Diagnosis not present

## 2022-07-22 DIAGNOSIS — R21 Rash and other nonspecific skin eruption: Secondary | ICD-10-CM | POA: Diagnosis not present

## 2022-07-22 DIAGNOSIS — K760 Fatty (change of) liver, not elsewhere classified: Secondary | ICD-10-CM | POA: Diagnosis not present

## 2023-03-03 IMAGING — CT CT RENAL STONE PROTOCOL
2 of 4 series · 16 of 46 positions shown, 18 images · non-contrast
Comparison: None.

CLINICAL DATA: Upper abdominal pain which began at 7997 hours

EXAM:
CT ABDOMEN AND PELVIS WITHOUT CONTRAST
TECHNIQUE: Multidetector CT imaging of the abdomen and pelvis was performed
following the standard protocol without IV contrast.

[Series 2: axial st · axial · 0.93mm/px · z∈[+801,+1271]mm · 13 of 104 slices shown, 15 images]
[im 5/104  soft-tissue]
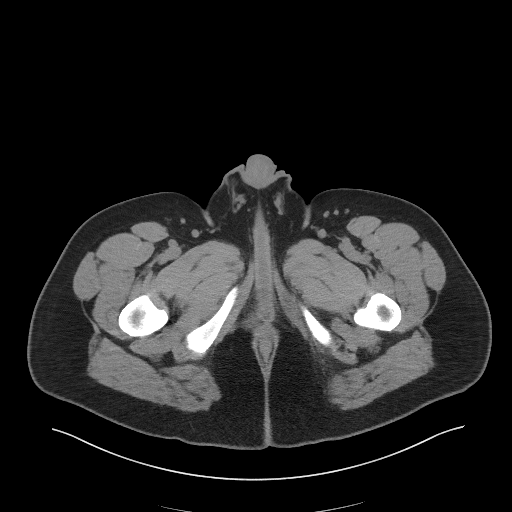
[im 5/104  bone]
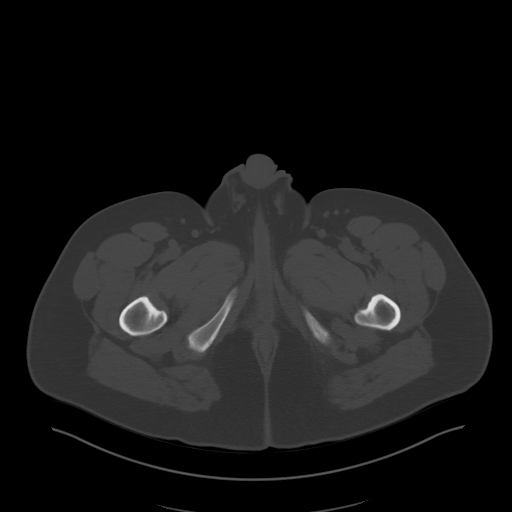
[im 14/104  soft-tissue]
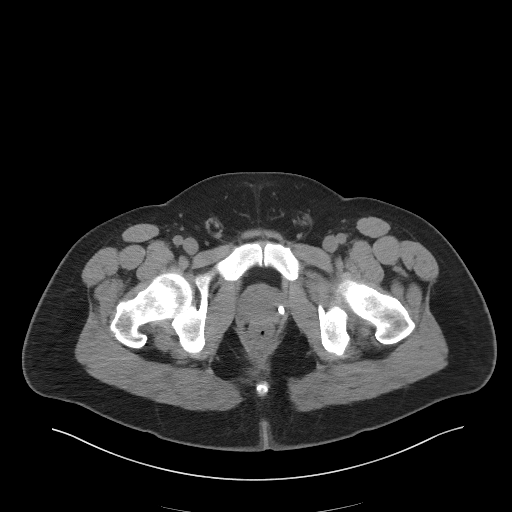
[im 23/104  soft-tissue]
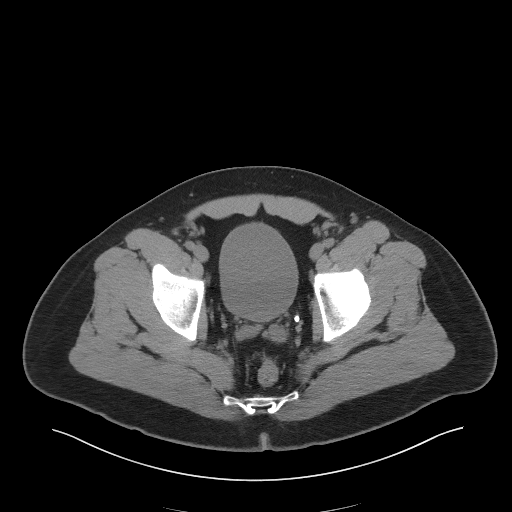
[im 27/104  soft-tissue]
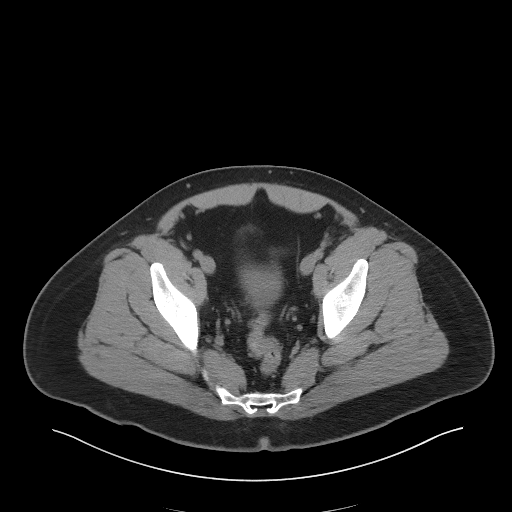
[im 36/104  soft-tissue]
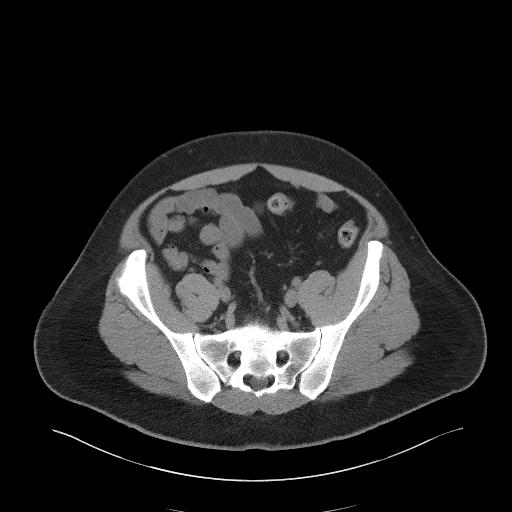
[im 45/104  soft-tissue]
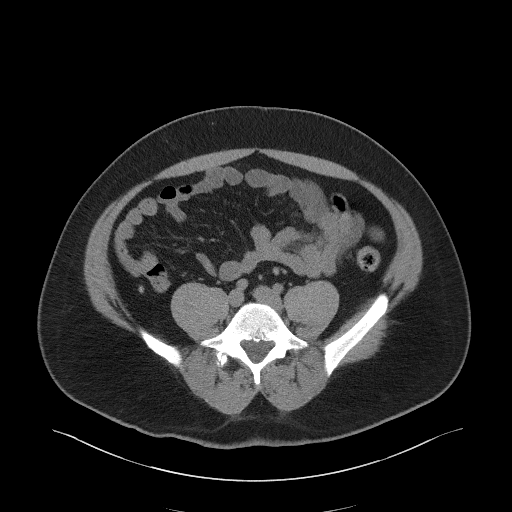
[im 54/104  soft-tissue]
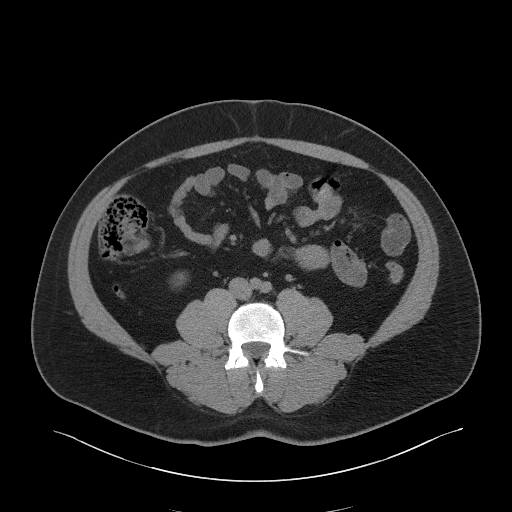
[im 59/104  soft-tissue]
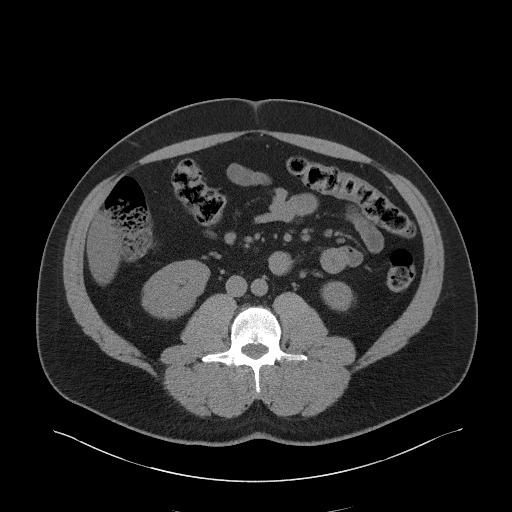
[im 68/104  soft-tissue]
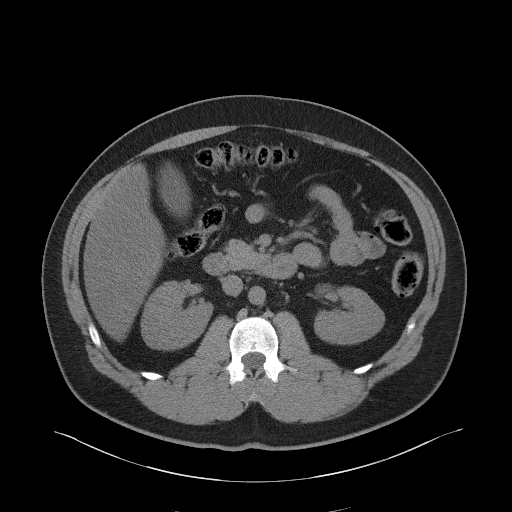
[im 68/104  bone]
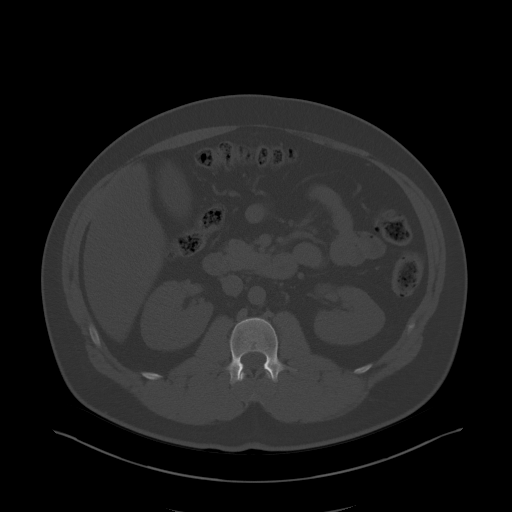
[im 77/104  soft-tissue]
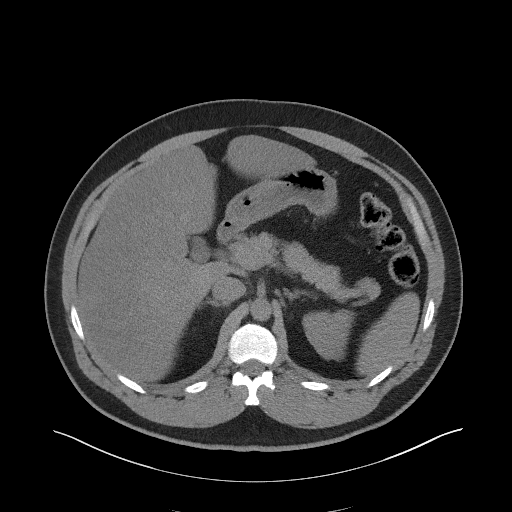
[im 81/104  soft-tissue]
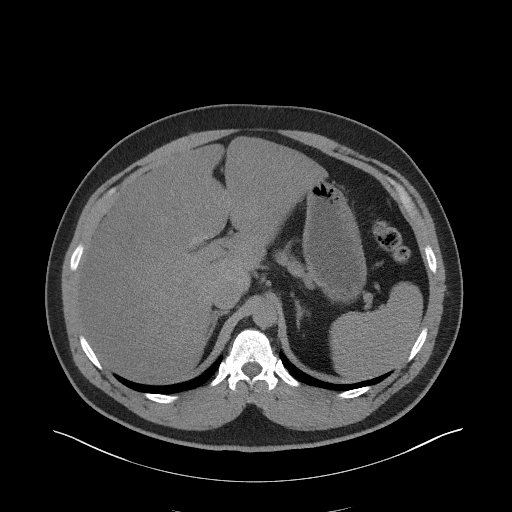
[im 90/104  soft-tissue]
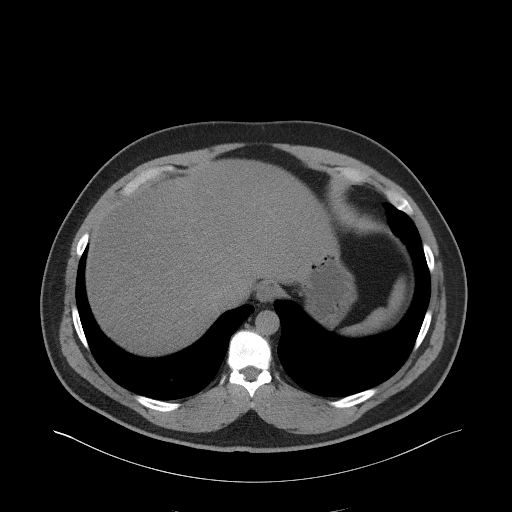
[im 99/104  soft-tissue]
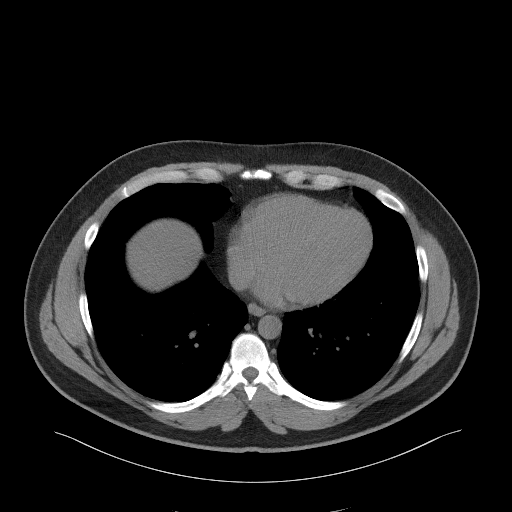

[Series 5: coronal st · coronal · 0.95mm/px · 3 of 126 slices shown]
[im 42/126  soft-tissue]
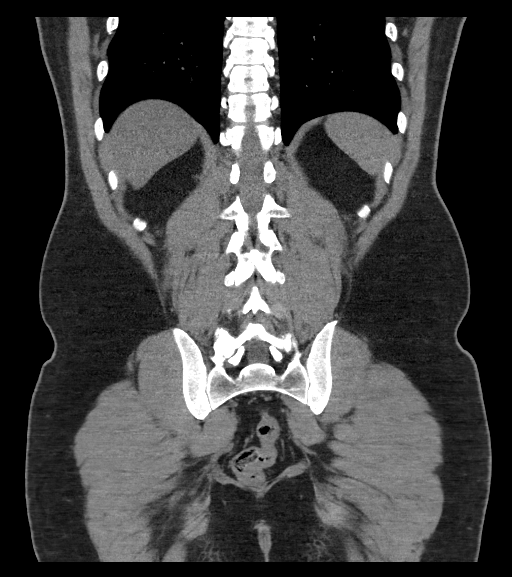
[im 56/126  soft-tissue]
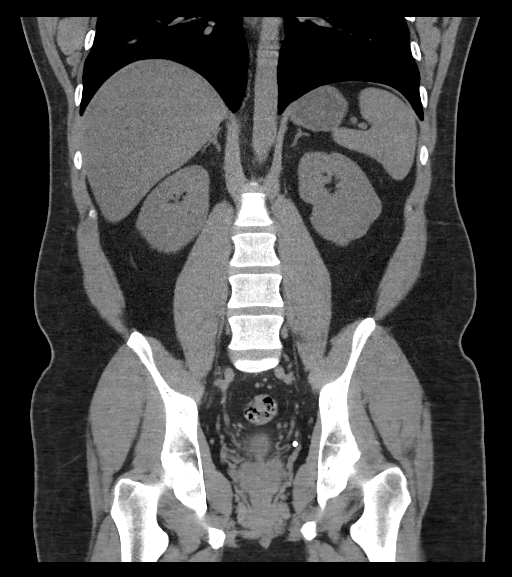
[im 70/126  soft-tissue]
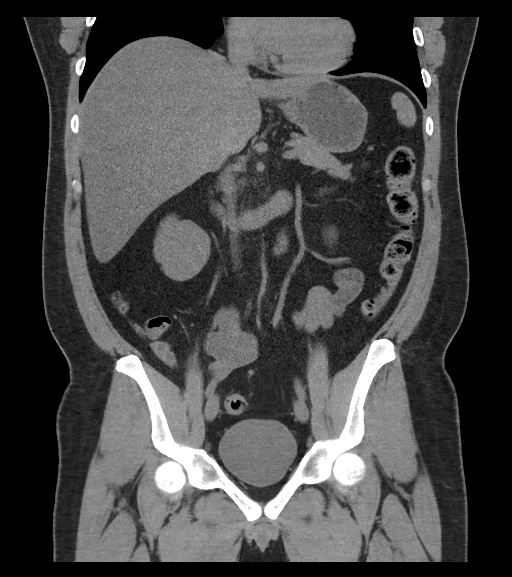

[16 of 46 positions shown; findings below may reference images not displayed]

FINDINGS: Lower chest: Lung bases are clear. Normal heart size. No pericardial
effusion.

Hepatobiliary: Diffuse hepatic hypoattenuation compatible with
hepatic steatosis. Regions of geographic focal fatty sparing are
seen along the gallbladder fossa. An additional rounded subcapsular
focus of intermediate attenuation measuring up to 2.1 cm in size
([DATE]) in the periphery of the right lobe liver, could reflect
additional fatty sparing or small lesion incompletely characterized
on this exam. Smooth liver surface contour. Physiologic gallbladder
distention. Few scattered calcified and partially pneumatized
gallstones are seen within the gallbladder lumen towards the neck.
No pericholecystic fluid or inflammation. No significant biliary
ductal dilatation or intraductal gallstones are evident.

Pancreas: No pancreatic ductal dilatation or surrounding
inflammatory changes.

Spleen: Normal in size. No concerning splenic lesions.

Adrenals/Urinary Tract: Normal adrenals. Kidneys are symmetric in
size and normally located. Some cortical lobulations may reflect
regions of scarring or persistent fetal lobulation. No visible or
contour deforming renal lesion. No urolithiasis or hydronephrosis.
Urinary bladder is unremarkable.

Stomach/Bowel: Distal esophagus, stomach and duodenum are
unremarkable and free of acute abnormality. No small bowel
thickening or dilatation. Normal air-filled appendix in the right
lower quadrant without periappendiceal inflammation. No colonic
dilatation or wall thickening. No evidence of bowel obstruction.

Vascular/Lymphatic: No significant vascular findings are present. No
enlarged abdominal or pelvic lymph nodes.

Reproductive: The prostate and seminal vesicles are unremarkable.

Other: No abdominopelvic free fluid or free gas. No bowel containing
hernias.

Musculoskeletal: No acute osseous abnormality or suspicious osseous
lesion.
IMPRESSION: No evidence of acute urinary tract abnormality, specifically no
hydronephrosis or obstructive urolithiasis.

Cholelithiasis without evidence of acute cholecystitis or biliary
ductal dilatation.

Diffuse hepatic steatosis with focal fatty sparing along the
gallbladder fossa. More rounded ill-defined subcapsular focus of
intermediate attenuation in the right lobe liver, could reflect
additional fatty sparing or a small indeterminate liver lesion.
Consider further evaluation with outpatient ultrasound or MR.

## 2023-08-11 DIAGNOSIS — Z0000 Encounter for general adult medical examination without abnormal findings: Secondary | ICD-10-CM | POA: Diagnosis not present

## 2023-08-11 DIAGNOSIS — Z6827 Body mass index (BMI) 27.0-27.9, adult: Secondary | ICD-10-CM | POA: Diagnosis not present
